# Patient Record
Sex: Female | Born: 1985 | Race: Black or African American | Hispanic: No | Marital: Single | Smoking: Never smoker
Health system: Southern US, Community
[De-identification: ages and names within clinical notes are randomized; demographics above are authoritative.]

---
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
## 2010-11-02 ENCOUNTER — Other Ambulatory Visit (HOSPITAL_COMMUNITY): Payer: Self-pay | Admitting: Obstetrics and Gynecology

## 2010-11-02 DIAGNOSIS — Z139 Encounter for screening, unspecified: Secondary | ICD-10-CM

## 2010-11-18 ENCOUNTER — Ambulatory Visit (HOSPITAL_COMMUNITY)
Admission: RE | Admit: 2010-11-18 | Discharge: 2010-11-18 | Disposition: A | Discharge status: Home / Self Care | Payer: Medicaid Other | Source: Ambulatory Visit | Attending: Obstetrics and Gynecology | Admitting: Obstetrics and Gynecology

## 2010-11-18 ENCOUNTER — Encounter (HOSPITAL_COMMUNITY): Payer: Self-pay

## 2010-11-18 DIAGNOSIS — Z139 Encounter for screening, unspecified: Secondary | ICD-10-CM

## 2010-11-18 DIAGNOSIS — Z3689 Encounter for other specified antenatal screening: Secondary | ICD-10-CM | POA: Insufficient documentation

## 2010-11-18 DIAGNOSIS — O3510X0 Maternal care for (suspected) chromosomal abnormality in fetus, unspecified, not applicable or unspecified: Secondary | ICD-10-CM | POA: Insufficient documentation

## 2010-11-18 DIAGNOSIS — O351XX0 Maternal care for (suspected) chromosomal abnormality in fetus, not applicable or unspecified: Secondary | ICD-10-CM | POA: Insufficient documentation

## 2010-11-19 ENCOUNTER — Other Ambulatory Visit (HOSPITAL_COMMUNITY): Payer: Self-pay | Admitting: Obstetrics and Gynecology

## 2010-11-19 DIAGNOSIS — Z0489 Encounter for examination and observation for other specified reasons: Secondary | ICD-10-CM

## 2010-12-10 ENCOUNTER — Ambulatory Visit (HOSPITAL_COMMUNITY)
Admission: RE | Admit: 2010-12-10 | Discharge: 2010-12-10 | Disposition: A | Discharge status: Home / Self Care | Payer: Medicaid Other | Source: Ambulatory Visit | Attending: Obstetrics and Gynecology | Admitting: Obstetrics and Gynecology

## 2010-12-10 DIAGNOSIS — O351XX0 Maternal care for (suspected) chromosomal abnormality in fetus, not applicable or unspecified: Secondary | ICD-10-CM | POA: Insufficient documentation

## 2010-12-10 DIAGNOSIS — O3510X0 Maternal care for (suspected) chromosomal abnormality in fetus, unspecified, not applicable or unspecified: Secondary | ICD-10-CM | POA: Insufficient documentation

## 2010-12-23 ENCOUNTER — Ambulatory Visit (HOSPITAL_COMMUNITY): Payer: Medicaid Other

## 2010-12-24 ENCOUNTER — Encounter (HOSPITAL_COMMUNITY): Payer: Self-pay

## 2010-12-24 ENCOUNTER — Ambulatory Visit (HOSPITAL_COMMUNITY)
Admission: RE | Admit: 2010-12-24 | Discharge: 2010-12-24 | Disposition: A | Discharge status: Home / Self Care | Payer: Medicaid Other | Source: Ambulatory Visit | Attending: Obstetrics and Gynecology | Admitting: Obstetrics and Gynecology

## 2010-12-24 DIAGNOSIS — Z0489 Encounter for examination and observation for other specified reasons: Secondary | ICD-10-CM

## 2010-12-24 DIAGNOSIS — Z1389 Encounter for screening for other disorder: Secondary | ICD-10-CM | POA: Insufficient documentation

## 2010-12-24 DIAGNOSIS — IMO0002 Reserved for concepts with insufficient information to code with codable children: Secondary | ICD-10-CM

## 2010-12-24 DIAGNOSIS — O358XX0 Maternal care for other (suspected) fetal abnormality and damage, not applicable or unspecified: Secondary | ICD-10-CM | POA: Insufficient documentation

## 2010-12-24 DIAGNOSIS — Z363 Encounter for antenatal screening for malformations: Secondary | ICD-10-CM | POA: Insufficient documentation

## 2014-04-07 ENCOUNTER — Encounter (HOSPITAL_COMMUNITY): Payer: Self-pay

## 2014-05-17 ENCOUNTER — Emergency Department (HOSPITAL_BASED_OUTPATIENT_CLINIC_OR_DEPARTMENT_OTHER)
Admission: EM | Admit: 2014-05-17 | Discharge: 2014-05-17 | Disposition: A | Discharge status: Home / Self Care | Payer: No Typology Code available for payment source | Attending: Emergency Medicine | Admitting: Emergency Medicine

## 2014-05-17 ENCOUNTER — Encounter (HOSPITAL_BASED_OUTPATIENT_CLINIC_OR_DEPARTMENT_OTHER): Payer: Self-pay | Admitting: *Deleted

## 2014-05-17 DIAGNOSIS — S46911A Strain of unspecified muscle, fascia and tendon at shoulder and upper arm level, right arm, initial encounter: Secondary | ICD-10-CM | POA: Diagnosis not present

## 2014-05-17 DIAGNOSIS — S46812A Strain of other muscles, fascia and tendons at shoulder and upper arm level, left arm, initial encounter: Secondary | ICD-10-CM

## 2014-05-17 DIAGNOSIS — Y998 Other external cause status: Secondary | ICD-10-CM | POA: Insufficient documentation

## 2014-05-17 DIAGNOSIS — S199XXA Unspecified injury of neck, initial encounter: Secondary | ICD-10-CM | POA: Insufficient documentation

## 2014-05-17 DIAGNOSIS — S46811A Strain of other muscles, fascia and tendons at shoulder and upper arm level, right arm, initial encounter: Secondary | ICD-10-CM

## 2014-05-17 DIAGNOSIS — Y92410 Unspecified street and highway as the place of occurrence of the external cause: Secondary | ICD-10-CM | POA: Diagnosis not present

## 2014-05-17 DIAGNOSIS — Y9389 Activity, other specified: Secondary | ICD-10-CM | POA: Diagnosis not present

## 2014-05-17 DIAGNOSIS — S39012A Strain of muscle, fascia and tendon of lower back, initial encounter: Secondary | ICD-10-CM | POA: Diagnosis not present

## 2014-05-17 DIAGNOSIS — S3992XA Unspecified injury of lower back, initial encounter: Secondary | ICD-10-CM | POA: Diagnosis present

## 2014-05-17 DIAGNOSIS — S46912A Strain of unspecified muscle, fascia and tendon at shoulder and upper arm level, left arm, initial encounter: Secondary | ICD-10-CM | POA: Insufficient documentation

## 2014-05-17 MED ORDER — IBUPROFEN 800 MG PO TABS: 800.0000 mg | ORAL_TABLET | Freq: Three times a day (TID) | ORAL | Status: AC
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
## 2014-06-12 ENCOUNTER — Emergency Department (HOSPITAL_BASED_OUTPATIENT_CLINIC_OR_DEPARTMENT_OTHER)
Admission: EM | Admit: 2014-06-12 | Discharge: 2014-06-12 | Disposition: A | Discharge status: Home / Self Care | Payer: No Typology Code available for payment source | Attending: Emergency Medicine | Admitting: Emergency Medicine

## 2014-06-12 ENCOUNTER — Encounter (HOSPITAL_BASED_OUTPATIENT_CLINIC_OR_DEPARTMENT_OTHER): Payer: Self-pay | Admitting: *Deleted

## 2014-06-12 ENCOUNTER — Emergency Department (HOSPITAL_BASED_OUTPATIENT_CLINIC_OR_DEPARTMENT_OTHER): Payer: No Typology Code available for payment source

## 2014-06-12 DIAGNOSIS — Y9289 Other specified places as the place of occurrence of the external cause: Secondary | ICD-10-CM | POA: Insufficient documentation

## 2014-06-12 DIAGNOSIS — W228XXA Striking against or struck by other objects, initial encounter: Secondary | ICD-10-CM | POA: Insufficient documentation

## 2014-06-12 DIAGNOSIS — Y9389 Activity, other specified: Secondary | ICD-10-CM | POA: Insufficient documentation

## 2014-06-12 DIAGNOSIS — Y998 Other external cause status: Secondary | ICD-10-CM | POA: Insufficient documentation

## 2014-06-12 DIAGNOSIS — S8991XA Unspecified injury of right lower leg, initial encounter: Secondary | ICD-10-CM | POA: Insufficient documentation
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
## 2014-06-12 NOTE — Discharge Instructions (Signed)

## 2015-09-16 IMAGING — CR DG KNEE COMPLETE 4+V*R*
4 series · 4 of 4 positions shown · non-contrast
Comparison: None.

CLINICAL DATA: Right knee pain secondary to blunt trauma.

EXAM:
RIGHT KNEE - COMPLETE 4+ VIEW

[t knee ap right]
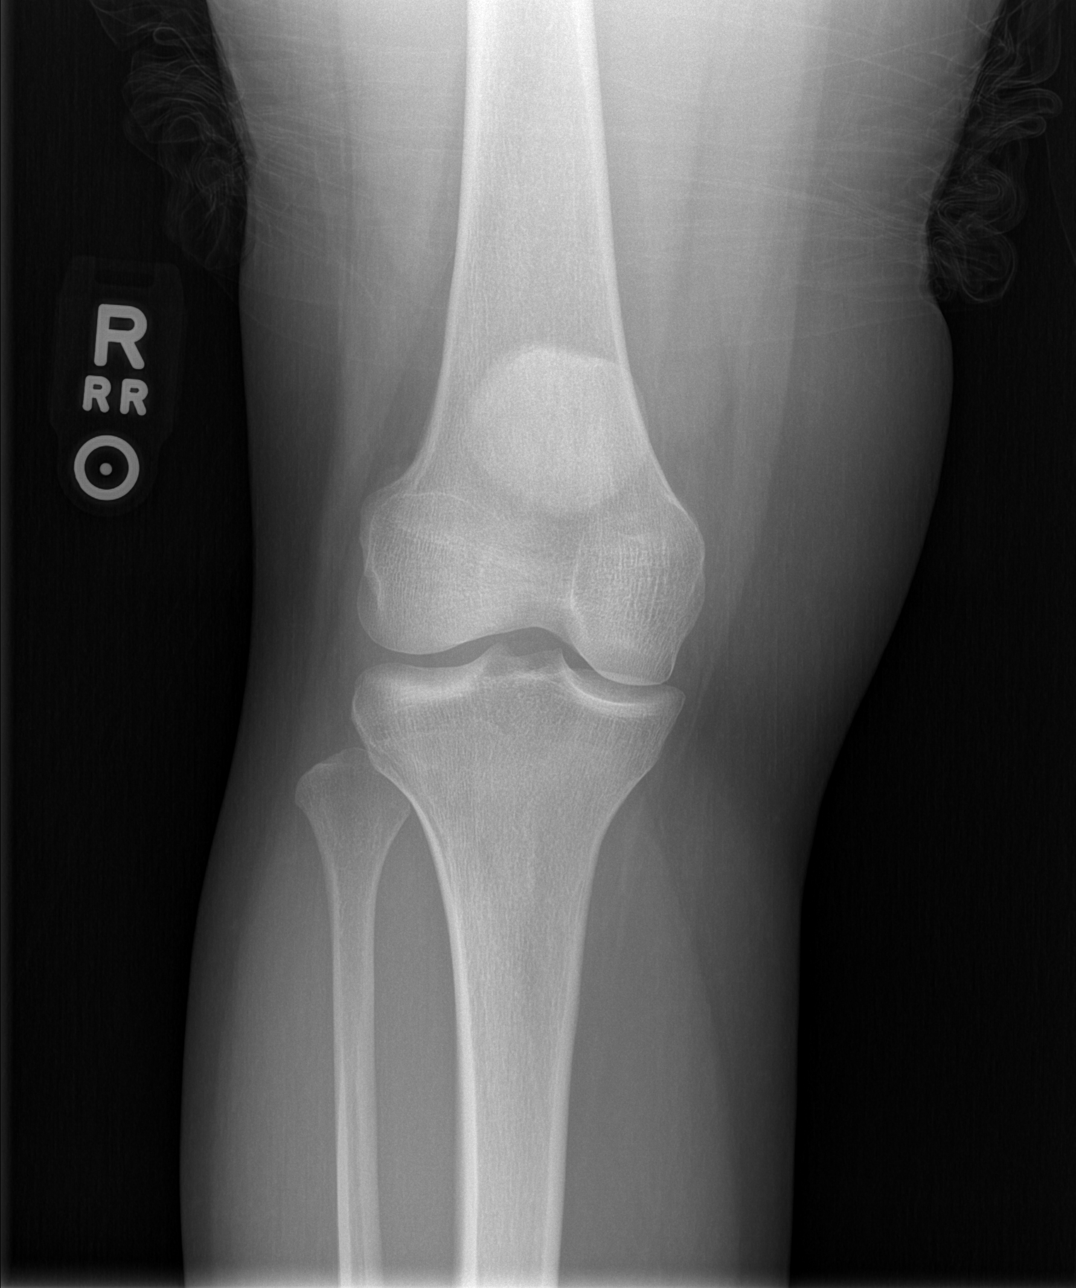

[t knee oblique right (1 of 2)]
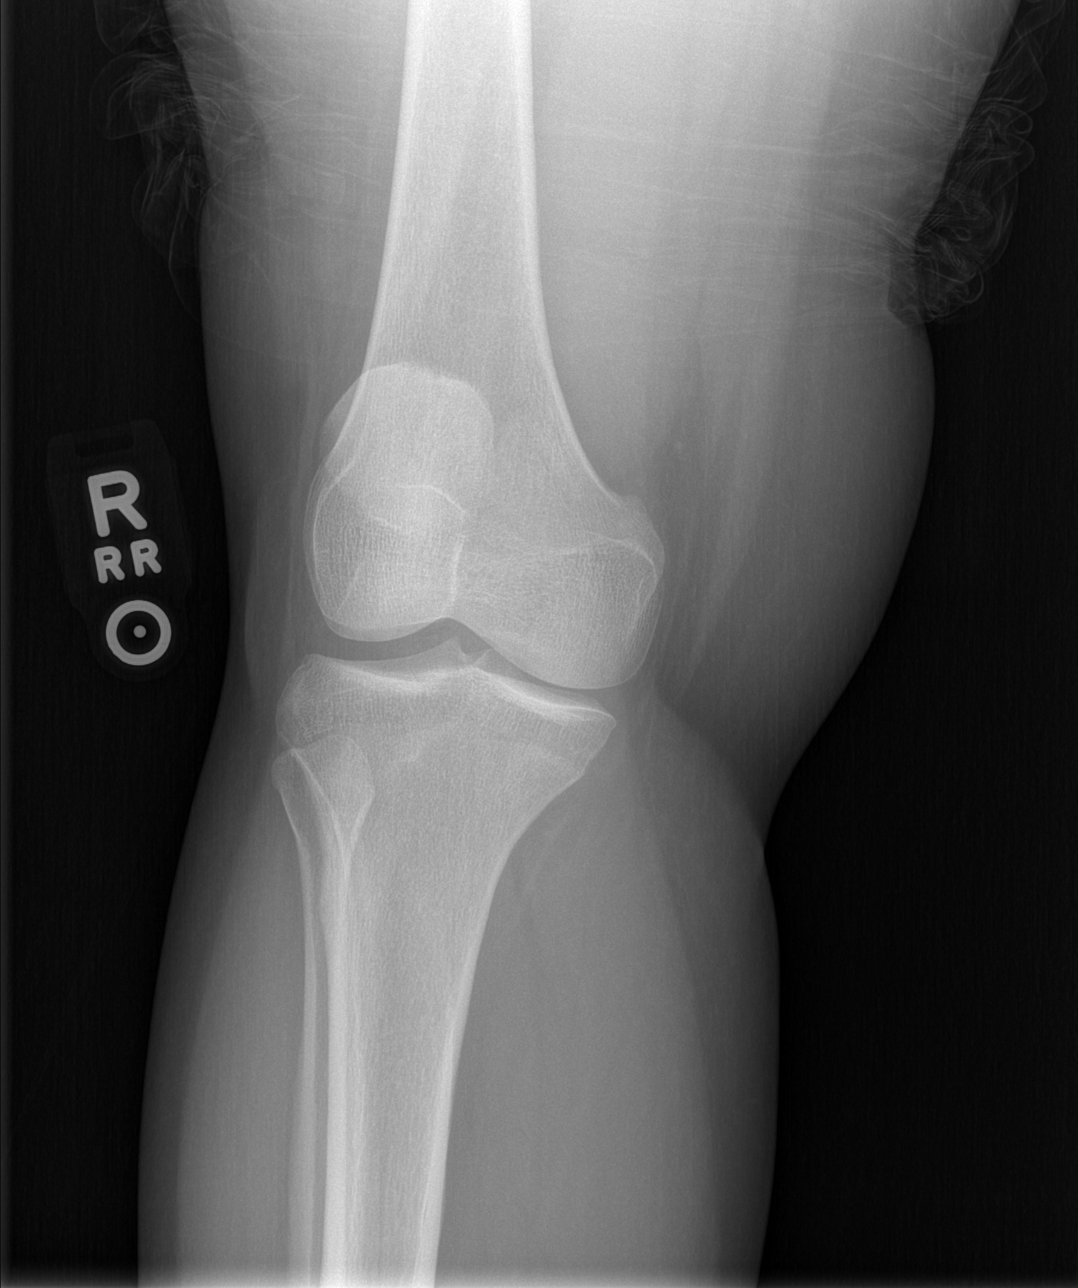

[t knee oblique right (2 of 2)]
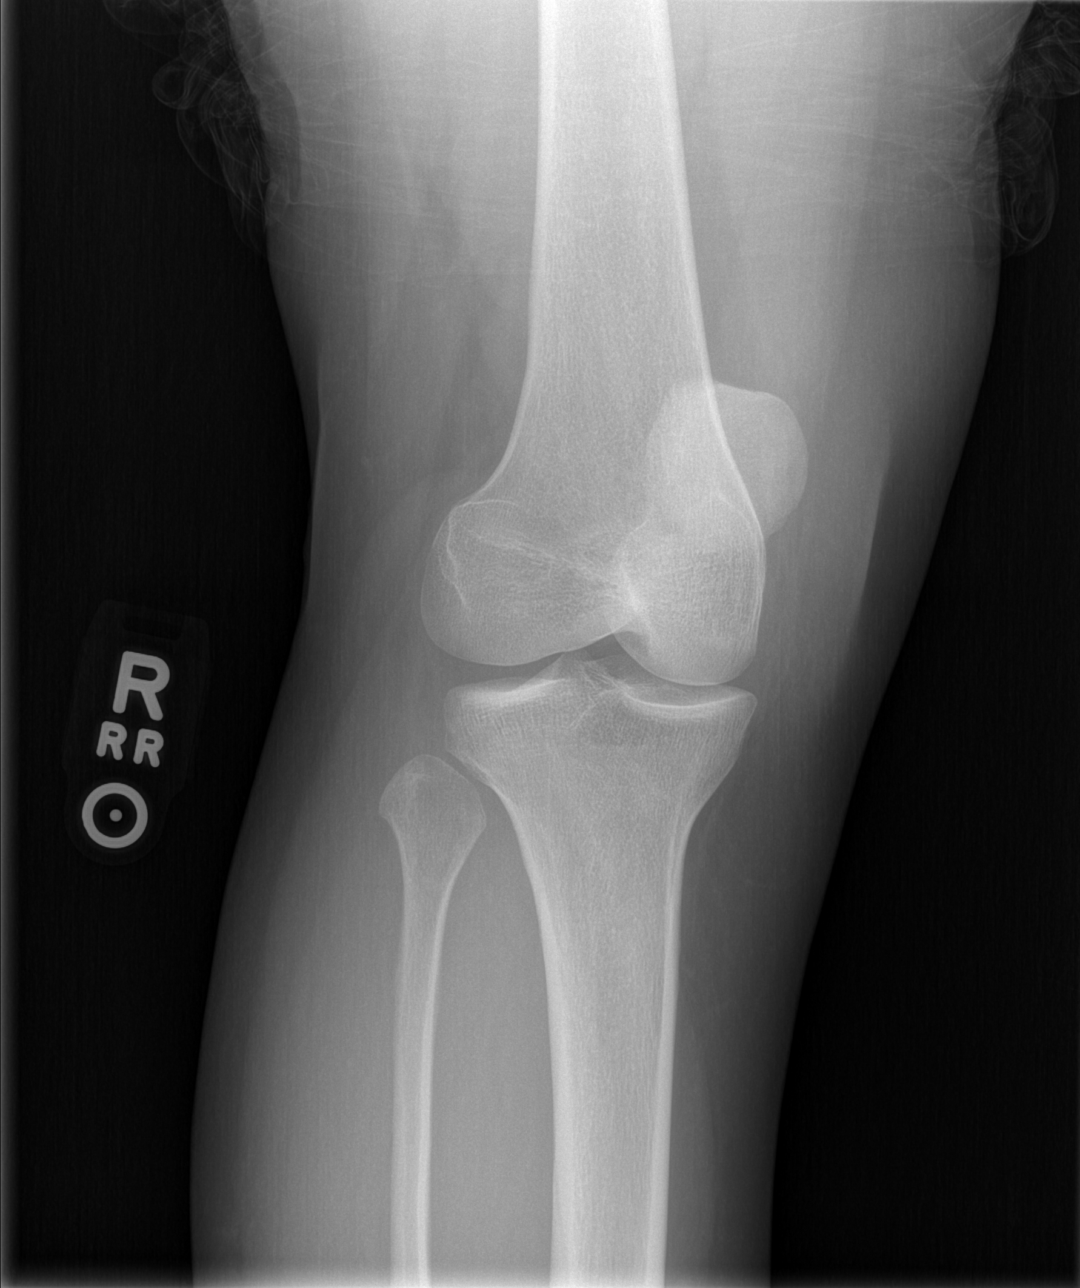

[t knee lat right]
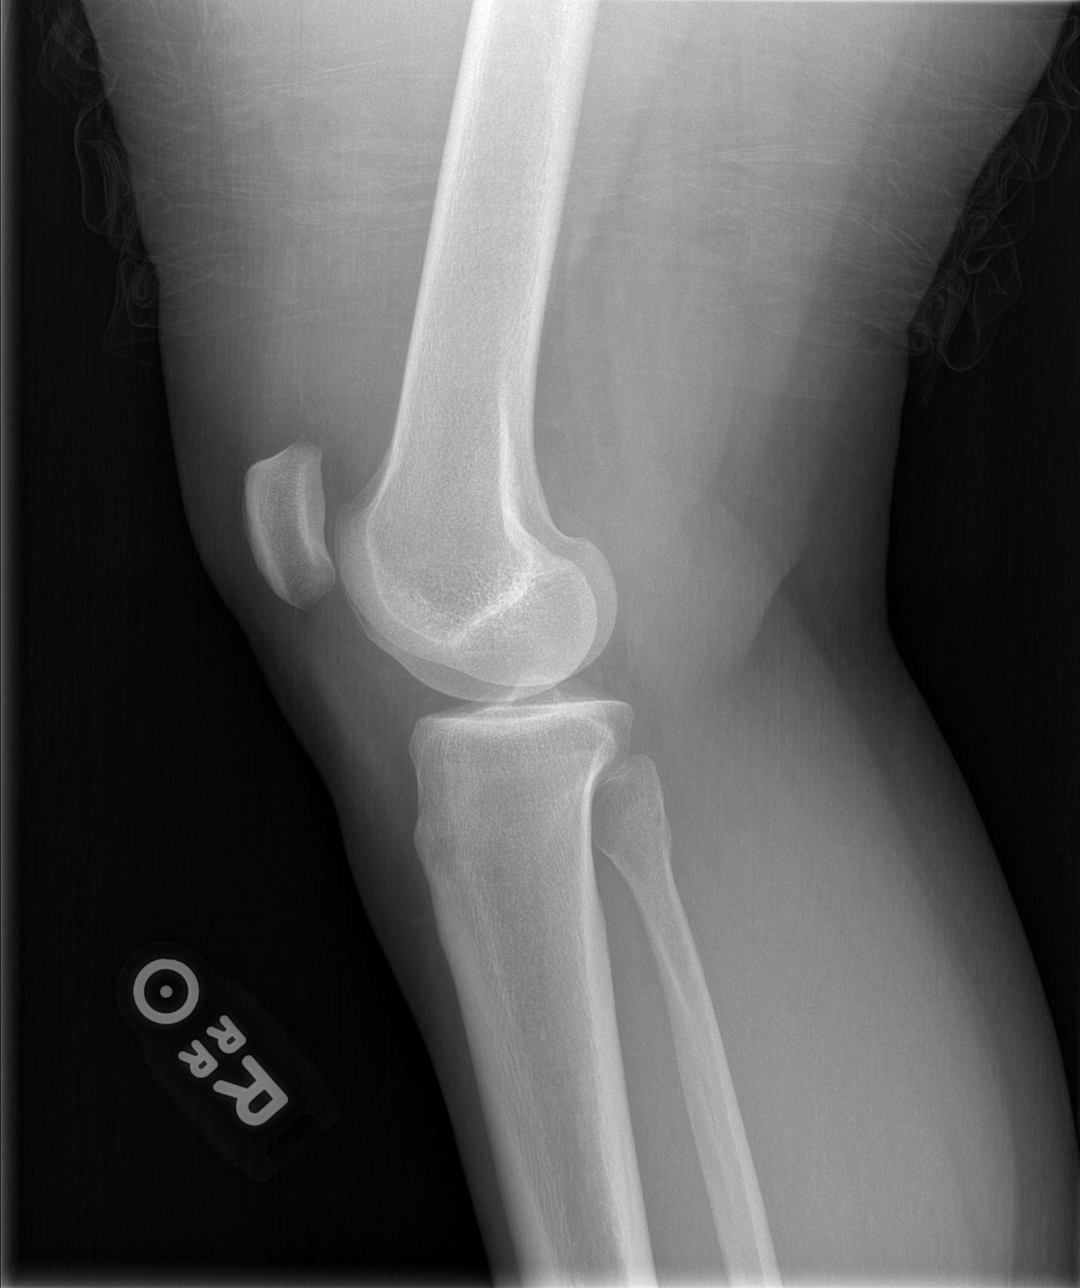

[4 of 4 positions shown; findings below may reference images not displayed]

FINDINGS: There is no evidence of fracture, dislocation, or joint effusion.
There is no evidence of arthropathy or other focal bone abnormality.
Soft tissues are unremarkable.
IMPRESSION: Normal exam.
